# Patient Record
Sex: Male | Born: 2000 | Race: Black or African American | Hispanic: No | Marital: Single | Smoking: Never smoker
Health system: Southern US, Community
[De-identification: ages and names within clinical notes are randomized; demographics above are authoritative.]

---
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
## 2021-11-18 ENCOUNTER — Other Ambulatory Visit: Payer: Self-pay | Admitting: Orthopedic Surgery

## 2021-11-18 DIAGNOSIS — M93261 Osteochondritis dissecans, right knee: Secondary | ICD-10-CM

## 2021-12-01 ENCOUNTER — Ambulatory Visit
Admission: RE | Admit: 2021-12-01 | Discharge: 2021-12-01 | Disposition: A | Discharge status: Home / Self Care | Payer: Medicaid Other | Source: Ambulatory Visit | Attending: Orthopedic Surgery | Admitting: Orthopedic Surgery

## 2021-12-01 DIAGNOSIS — M93261 Osteochondritis dissecans, right knee: Secondary | ICD-10-CM

## 2021-12-02 ENCOUNTER — Other Ambulatory Visit: Payer: Self-pay | Admitting: Orthopedic Surgery

## 2021-12-02 ENCOUNTER — Ambulatory Visit
Admission: RE | Admit: 2021-12-02 | Discharge: 2021-12-02 | Disposition: A | Discharge status: Home / Self Care | Payer: Medicaid Other | Source: Ambulatory Visit | Attending: Orthopedic Surgery | Admitting: Orthopedic Surgery

## 2021-12-02 DIAGNOSIS — M93261 Osteochondritis dissecans, right knee: Secondary | ICD-10-CM

## 2021-12-03 ENCOUNTER — Other Ambulatory Visit: Payer: Medicaid Other

## 2022-01-31 ENCOUNTER — Encounter (HOSPITAL_BASED_OUTPATIENT_CLINIC_OR_DEPARTMENT_OTHER): Payer: Self-pay | Admitting: Emergency Medicine

## 2022-01-31 ENCOUNTER — Emergency Department (HOSPITAL_BASED_OUTPATIENT_CLINIC_OR_DEPARTMENT_OTHER)
Admission: EM | Admit: 2022-01-31 | Discharge: 2022-01-31 | Disposition: A | Discharge status: Home / Self Care | Payer: Medicaid Other | Attending: Emergency Medicine | Admitting: Emergency Medicine

## 2022-01-31 ENCOUNTER — Other Ambulatory Visit: Payer: Self-pay

## 2022-01-31 ENCOUNTER — Emergency Department (HOSPITAL_BASED_OUTPATIENT_CLINIC_OR_DEPARTMENT_OTHER): Payer: Medicaid Other

## 2022-01-31 DIAGNOSIS — S61210A Laceration without foreign body of right index finger without damage to nail, initial encounter: Secondary | ICD-10-CM | POA: Diagnosis not present

## 2022-01-31 DIAGNOSIS — Y92137 Garden or yard on military base as the place of occurrence of the external cause: Secondary | ICD-10-CM | POA: Diagnosis not present

## 2022-01-31 DIAGNOSIS — W268XXA Contact with other sharp object(s), not elsewhere classified, initial encounter: Secondary | ICD-10-CM | POA: Insufficient documentation

## 2022-01-31 DIAGNOSIS — Z23 Encounter for immunization: Secondary | ICD-10-CM | POA: Diagnosis not present

## 2022-01-31 DIAGNOSIS — S65500A Unspecified injury of blood vessel of right index finger, initial encounter: Secondary | ICD-10-CM | POA: Diagnosis present

## 2022-01-31 MED ORDER — LIDOCAINE HCL 2 % IJ SOLN
20.0000 mL | Freq: Once | INTRAMUSCULAR | Status: AC
  Administered 2022-01-31: 400 mg via INTRADERMAL
  Filled 2022-01-31: qty 20

## 2022-01-31 MED ORDER — TETANUS-DIPHTH-ACELL PERTUSSIS 5-2.5-18.5 LF-MCG/0.5 IM SUSY
0.5000 mL | PREFILLED_SYRINGE | Freq: Once | INTRAMUSCULAR | Status: AC
  Administered 2022-01-31: 0.5 mL via INTRAMUSCULAR
  Filled 2022-01-31: qty 0.5

## 2022-01-31 NOTE — ED Triage Notes (Signed)
Pt reports weed eater blade cut his right index finger today. Bleeding controlled.

## 2022-01-31 NOTE — ED Provider Notes (Signed)
[facility] EMERGENCY DEPARTMENT Provider Note   CSN: [id number] Arrival date & time: 01/31/22  1520     History  Chief Complaint  Patient presents with   Finger Injury    Edwin Blair is a 21 y.o. male.  Patient presents to the emergency department today for evaluation of right index finger laceration.  Patient was working in his yard with a Scientist, physiologicalweedeater.  He was wearing gloves.  He is uncertain if he cut his finger with an attachment that he had on the weedeater or by pulling some vines.  Unknown last tetanus.  No distal numbness or tingling.  Bleeding controlled prior to arrival with pressure bandage.  Denies other injuries.  No numbness or tingling.      Home Medications Prior to Admission medications   Not on File      Allergies    Patient has no known allergies.    Review of Systems   Review of Systems  Physical Exam Updated Vital Signs BP (!) 125/90 (BP Location: Left Arm)   Pulse 96   Temp 98.5 F (36.9 C) (Oral)   Resp 18   Ht 5\' 9"  (1.753 m)   Wt 90.7 kg   SpO2 97%   BMI 29.53 kg/m  Physical Exam Vitals and nursing note reviewed.  Constitutional:      Appearance: He is well-developed.  HENT:     Head: Normocephalic and atraumatic.  Eyes:     Conjunctiva/sclera: Conjunctivae normal.  Pulmonary:     Effort: No respiratory distress.  Musculoskeletal:     Cervical back: Normal range of motion and neck supple.     Comments: Right hand: Index finger with 2 lacerations, one just proximal to the DIP crease and one just distal.  Patient is able to flex at the MCP, PIP, DIP.  Capillary refill less than 2 seconds.  Normal sensation distally.   Proximal laceration: Approx 8mm in length, wound base clean.  Extends into subcutaneous tissue.  Distal laceration: Approximately 12 mm in length, wound base clean.  No foreign bodies.  I can visualize a portion of the flexor tendon, however I do not see any cuts into or disruptions of this.  Which is  consistent with exam.  Skin:    General: Skin is warm and dry.  Neurological:     Mental Status: He is alert.     ED Results / Procedures / Treatments   Labs (all labs ordered are listed, but only abnormal results are displayed) Labs Reviewed - No data to display  EKG None  Radiology DG Finger Index Right  Result Date: 01/31/2022 CLINICAL DATA:  Laceration from blade.  Weed eating today. EXAM: RIGHT INDEX FINGER 2+V COMPARISON:  None Available. FINDINGS: There is irregularity of the distal volar finger soft tissues at the level of the distal aspect of the middle phalanx. The index finger bone cortices are intact. Joint spaces are preserved. No acute fracture for dislocation. Mild thumb carpometacarpal and triscaphe joint space narrowing. No radiopaque foreign body. IMPRESSION: No acute fracture or radiopaque foreign body. Electronically Signed   By: Neita Garnetonald  Viola M.D.   On: 01/31/2022 16:02    Procedures .Marland Kitchen.Laceration Repair  Date/Time: 01/31/2022 6:35 PM  Performed by: Renne CriglerGeiple, Zoriana Oats, PA-C Authorized by: Renne CriglerGeiple, Khushboo Chuck, PA-C   Consent:    Consent obtained:  Verbal   Consent given by:  Patient   Risks discussed:  Infection, pain, tendon damage, vascular damage and need for additional repair Universal protocol:  Patient identity confirmed:  Verbally with patient Anesthesia:    Anesthesia method:  Nerve block   Block location:  R index finger   Block needle gauge:  27 G   Block anesthetic:  Lidocaine 2% w/o epi   Block technique:  Single injection into flexor tendon sheath   Block injection procedure:  Anatomic landmarks identified, anatomic landmarks palpated, introduced needle, negative aspiration for blood and incremental injection   Block outcome:  Anesthesia achieved Laceration details:    Location:  Finger   Finger location:  R index finger   Length (cm):  0.8 Pre-procedure details:    Preparation:  Patient was prepped and draped in usual sterile fashion and imaging  obtained to evaluate for foreign bodies Exploration:    Hemostasis achieved with:  Tourniquet   Imaging obtained: x-ray     Imaging outcome: foreign body not noted     Wound exploration: wound explored through full range of motion and entire depth of wound visualized     Wound extent: no foreign bodies/material noted, no tendon damage noted and no underlying fracture noted   Treatment:    Area cleansed with:  Saline   Amount of cleaning:  Extensive   Irrigation volume:  500   Irrigation method:  Pressure wash Skin repair:    Repair method:  Sutures   Suture size:  5-0   Suture material:  Nylon   Suture technique:  Simple interrupted   Number of sutures:  3 Approximation:    Approximation:  Close Repair type:    Repair type:  Simple Post-procedure details:    Dressing:  Open (no dressing)   Procedure completion:  Tolerated well, no immediate complications   .Marland KitchenLaceration Repair  Date/Time: 01/31/2022 6:35 PM  Performed by: Carlisle Cater, PA-C Authorized by: Carlisle Cater, PA-C   Consent:    Consent obtained:  Verbal   Consent given by:  Patient   Risks discussed:  Infection, pain, tendon damage, vascular damage and need for additional repair Universal protocol:    Patient identity confirmed:  Verbally with patient Anesthesia:    Anesthesia method:  Nerve block   Block location:  R index finger   Block needle gauge:  27 G   Block anesthetic:  Lidocaine 2% w/o epi   Block technique:  Single injection into flexor tendon sheath   Block injection procedure:  Anatomic landmarks identified, anatomic landmarks palpated, introduced needle, negative aspiration for blood and incremental injection   Block outcome:  Anesthesia achieved Laceration details:    Location:  Finger   Finger location:  R index finger   Length (cm):  1.2 Pre-procedure details:    Preparation:  Patient was prepped and draped in usual sterile fashion and imaging obtained to evaluate for foreign  bodies Exploration:    Hemostasis achieved with:  Tourniquet   Imaging obtained: x-ray     Imaging outcome: foreign body not noted     Wound exploration: wound explored through full range of motion and entire depth of wound visualized     Wound extent: no foreign bodies/material noted, no tendon damage noted (although wound extends to flexor tendon) and no underlying fracture noted   Treatment:    Area cleansed with:  Saline   Amount of cleaning:  Extensive   Irrigation volume:  500   Irrigation method:  Pressure wash Skin repair:    Repair method:  Sutures   Suture size:  5-0   Suture material:  Nylon   Suture technique:  Simple interrupted   Number of sutures:  4 Approximation:    Approximation:  Close Repair type:    Repair type:  Simple Post-procedure details:    Dressing:  Open (no dressing)   Procedure completion:  Tolerated well, no immediate complications    Medications Ordered in ED Medications  lidocaine (XYLOCAINE) 2 % (with pres) injection 400 mg (400 mg Intradermal Given 01/31/22 1806)  Tdap (BOOSTRIX) injection 0.5 mL (0.5 mLs Intramuscular Given 01/31/22 1805)    ED Course/ Medical Decision Making/ A&P    Patient seen and examined. History obtained directly from patient.   Labs/EKG: None ordered  Imaging personally reviewed and interpreted: X-ray of finger, agree negative for fracture or foreign body.  Medications/Fluids: Ordered: Tetanus update, lidocaine 2% without epinephrine.    Most recent vital signs reviewed and are as follows: BP (!) 125/90 (BP Location: Left Arm)   Pulse 96   Temp 98.5 F (36.9 C) (Oral)   Resp 18   Ht 5\' 9"  (1.753 m)   Wt 90.7 kg   SpO2 97%   BMI 29.53 kg/m   Initial impression: Right index finger laceration.  6:35 PM Reassessment performed. Patient appears comfortable. Exam unchanged. Tolerated repair well.   No lidocaine visualized through the wound during digital block, using single injection into flexor tendon  sheath of the right index finger.  Reviewed pertinent lab work and imaging with patient at bedside. Questions answered.   Most current vital signs reviewed and are as follows: BP (!) 125/90 (BP Location: Left Arm)   Pulse 96   Temp 98.5 F (36.9 C) (Oral)   Resp 18   Ht 5\' 9"  (1.753 m)   Wt 90.7 kg   SpO2 97%   BMI 29.53 kg/m   Plan: Discharge to home.   Prescriptions written for: None  Other home care instructions discussed: Patient counseled on wound care.  Left patient use splint for a few days.  ED return instructions discussed: Patient was urged to return to the Emergency Department urgently with worsening pain, swelling, expanding erythema especially if it streaks away from the affected area, fever, or if they have any other concerns.   Follow-up instructions discussed: Patient counseled on need to return or see PCP/urgent care for suture removal in 10-14 days.  Encouraged orthopedic follow-up if he has any issues with strength in his finger or persistent pain.  Patient has established orthopedic care.                             Medical Decision Making Amount and/or Complexity of Data Reviewed Radiology: ordered.  Risk Prescription drug management.   Patient with 2 lacerations to the right index finger, repaired without complication.  The larger of the 2 lacerations is deeper, extends to the flexor tendon, but I do not see any disruption.  Patient has full range of motion on exam with strength in the distal fingertip.  Also, during administration of lidocaine, did not see any leakage of lidocaine through the wound, therefore feel that possibility of tendon sheath disruption is low.        Final Clinical Impression(s) / ED Diagnoses Final diagnoses:  Laceration of right index finger without foreign body without damage to nail, initial encounter    Rx / DC Orders ED Discharge Orders     None         , PA-C 01/31/22 1842    Tegeler,  Renne Crigler,  MD 01/31/22 2335

## 2022-01-31 NOTE — Discharge Instructions (Signed)
Please read and follow all provided instructions.  Your diagnoses today include:  1. Laceration of right index finger without foreign body without damage to nail, initial encounter    Tests performed today include: X-ray of the affected area that did not show any foreign bodies or broken bones Vital signs. See below for your results today.   Medications prescribed:  Please use over-the-counter NSAID medications (ibuprofen, naproxen) or Tylenol (acetaminophen) as directed on the packaging for pain -- as long as you do not have any reasons avoid these medications. Reasons to avoid NSAID medications include: weak kidneys, a history of bleeding in your stomach or gut, or uncontrolled high blood pressure or previous heart attack. Reasons to avoid Tylenol include: liver problems or ongoing alcohol use. Never take more than 4000mg  or 8 Extra strength Tylenol in a 24 hour period.     Take any prescribed medications only as directed.   Home care instructions:  Follow any educational materials and wound care instructions contained in this packet.   Keep affected area above the level of your heart when possible to minimize swelling. Wash area gently twice a day with warm soapy water. Do not apply alcohol or hydrogen peroxide. Cover the area if it draining or weeping.   Follow-up instructions: Suture Removal: Return to the Emergency Department or see your primary care care doctor in 10-14 days for a recheck of your wound and removal of your sutures or staples.    Return instructions:  Return to the Emergency Department if you have: Fever Worsening pain Worsening swelling of the wound Pus draining from the wound Redness of the skin that moves away from the wound, especially if it streaks away from the affected area  Any other emergent concerns  Your vital signs today were: BP (!) 125/90 (BP Location: Left Arm)   Pulse 96   Temp 98.5 F (36.9 C) (Oral)   Resp 18   Ht 5\' 9"  (1.753 m)   Wt 90.7  kg   SpO2 97%   BMI 29.53 kg/m  If your blood pressure (BP) was elevated above 135/85 this visit, please have this repeated by your doctor within one month. --------------
